# Patient Record
Sex: Female | Born: 1950 | Race: White | Hispanic: No | Smoking: Never smoker
Health system: Southern US, Community
[De-identification: ages and names within clinical notes are randomized; demographics above are authoritative.]

## PROBLEM LIST (undated history)

## (undated) HISTORY — PX: TUBAL LIGATION: SHX77

---

## 2012-02-29 ENCOUNTER — Emergency Department (HOSPITAL_BASED_OUTPATIENT_CLINIC_OR_DEPARTMENT_OTHER): Payer: Managed Care, Other (non HMO)

## 2012-02-29 ENCOUNTER — Encounter (HOSPITAL_BASED_OUTPATIENT_CLINIC_OR_DEPARTMENT_OTHER): Payer: Self-pay | Admitting: Family Medicine

## 2012-02-29 ENCOUNTER — Emergency Department (HOSPITAL_BASED_OUTPATIENT_CLINIC_OR_DEPARTMENT_OTHER)
Admission: EM | Admit: 2012-02-29 | Discharge: 2012-02-29 | Disposition: A | Discharge status: Home / Self Care | Payer: Managed Care, Other (non HMO) | Attending: Emergency Medicine | Admitting: Emergency Medicine

## 2012-02-29 DIAGNOSIS — R079 Chest pain, unspecified: Secondary | ICD-10-CM | POA: Insufficient documentation

## 2012-02-29 LAB — D-DIMER, QUANTITATIVE: D-Dimer, Quant: 0.27 ug/mL-FEU (ref 0.00–0.48)

## 2012-02-29 LAB — CK TOTAL AND CKMB (NOT AT ARMC)
CK, MB: 2.6 ng/mL (ref 0.3–4.0)
Total CK: 118 U/L (ref 7–177)

## 2012-02-29 LAB — CBC WITH DIFFERENTIAL/PLATELET
Basophils Absolute: 0 10*3/uL (ref 0.0–0.1)
Basophils Relative: 0 % (ref 0–1)
HCT: 38.9 % (ref 36.0–46.0)
MCHC: 33.2 g/dL (ref 30.0–36.0)
Monocytes Absolute: 0.6 10*3/uL (ref 0.1–1.0)
Neutro Abs: 5.1 10*3/uL (ref 1.7–7.7)
Neutrophils Relative %: 59 % (ref 43–77)
Platelets: 225 10*3/uL (ref 150–400)
RDW: 13.2 % (ref 11.5–15.5)

## 2012-02-29 LAB — COMPREHENSIVE METABOLIC PANEL
AST: 16 U/L (ref 0–37)
Albumin: 4.1 g/dL (ref 3.5–5.2)
Chloride: 99 mEq/L (ref 96–112)
Creatinine, Ser: 0.8 mg/dL (ref 0.50–1.10)
Sodium: 138 mEq/L (ref 135–145)
Total Bilirubin: 0.2 mg/dL — ABNORMAL LOW (ref 0.3–1.2)

## 2012-02-29 MED ORDER — HYDROCODONE-ACETAMINOPHEN 5-325 MG PO TABS: 1.0000 | ORAL_TABLET | Freq: Four times a day (QID) | ORAL | Status: AC | PRN

## 2012-02-29 MED ORDER — ASPIRIN 81 MG PO CHEW
324.0000 mg | CHEWABLE_TABLET | Freq: Once | ORAL | Status: AC
  Administered 2012-02-29: 324 mg via ORAL
  Filled 2012-02-29: qty 4

## 2012-02-29 NOTE — ED Notes (Signed)
Pt states that she is not experiencing any chest pain at this time.

## 2012-02-29 NOTE — ED Notes (Signed)
Report received from [place]. Care assumed.

## 2012-02-29 NOTE — ED Notes (Signed)
MD at bedside. 

## 2012-02-29 NOTE — ED Notes (Signed)
Pt states that she has experienced a decrease in chest pain since arrival. States she feels "like I am in a safe place. I have been really anxious."

## 2012-02-29 NOTE — ED Notes (Signed)
Pt c/o chest pain since last Friday worse and more constant today. Pt denies shob, n/v, dizziness. Pt sts she has h/o palpitations and was seen on Thursday at [facility] in [place] and sts cardiac panel and xr was negative.

## 2012-02-29 NOTE — ED Provider Notes (Signed)
History     CSN: [id number]  Arrival date & time 02/29/12  1439   First MD Initiated Contact with Patient 02/29/12 1519      Chief Complaint  Patient presents with  . Chest Pain    (Consider location/radiation/quality/duration/timing/severity/associated sxs/prior treatment) Patient is a 61 y.o. female presenting with chest pain. The history is provided by the patient.  Chest Pain The chest pain began 3 - 5 days ago. Chest pain occurs constantly. The chest pain is worsening. At its most intense, the pain is at 8/10. The pain is currently at 8/10. The quality of the pain is described as aching and sharp. The pain does not radiate. Pertinent negatives for primary symptoms include no fever, no syncope, no shortness of breath, no cough, no abdominal pain, no nausea and no vomiting.  Pertinent negatives for associated symptoms include no diaphoresis and no near-syncope. She tried aspirin for the symptoms. Risk factors include no known risk factors.    patient with left-sided substernal chest pain it does move down to the lower part of the chest on the left side that has essentially been constant since Friday that would be for 5 days almost better with moving and better with food no coronary artery disease, no hypertension no diabetes. Patient was seen at the [facility] on Thursday for palpitations and was referred to cardiology. They noted PVCs at that time. Patient is taking a baby aspirin a day.  History reviewed. No pertinent past medical history.  History reviewed. No pertinent past surgical history.  No family history on file.  History  Substance Use Topics  . Smoking status: Never Smoker   . Smokeless tobacco: Not on file  . Alcohol Use: No    OB History    Grav Para Term Preterm Abortions TAB SAB Ect Mult Living                  Review of Systems  Constitutional: Negative for fever and diaphoresis.  HENT: Negative for neck pain.   Eyes: Negative for redness and  visual disturbance.  Respiratory: Negative for cough and shortness of breath.   Cardiovascular: Positive for chest pain. Negative for syncope and near-syncope.  Gastrointestinal: Negative for nausea, vomiting and abdominal pain.  Genitourinary: Negative for dysuria.  Musculoskeletal: Negative for back pain.  Skin: Negative for rash.  Neurological: Negative for headaches.  Hematological: Does not bruise/bleed easily.    Allergies  Zithromax  Home Medications   Current Outpatient Rx  Name Route Sig Dispense Refill  . ASPIRIN 81 MG PO TABS Oral Take 81 mg by mouth daily.    Marland Kitchen NAPROXEN SODIUM 220 MG PO TABS Oral Take 440 mg by mouth 2 (two) times daily with a meal. For pain.    Marland Kitchen HYDROCODONE-ACETAMINOPHEN 5-325 MG PO TABS Oral Take 1-2 tablets by mouth every 6 (six) hours as needed for pain. 10 tablet 0    BP 146/76  Pulse 81  Resp 18  Ht 5\' 3"  (1.6 m)  Wt 198 lb (89.812 kg)  BMI 35.07 kg/m2  SpO2 98%  Physical Exam  Nursing note and vitals reviewed. Constitutional: She is oriented to person, place, and time. She appears well-developed and well-nourished.  HENT:  Head: Normocephalic and atraumatic.  Eyes: Conjunctivae and EOM are normal. Pupils are equal, round, and reactive to light.  Neck: Neck supple.  Cardiovascular: Normal rate and regular rhythm.   No murmur heard. Pulmonary/Chest: Effort normal and breath sounds normal.  Abdominal: Soft. Bowel  sounds are normal. There is no tenderness.  Musculoskeletal: Normal range of motion.  Neurological: She is alert and oriented to person, place, and time. No cranial nerve deficit. She exhibits normal muscle tone. Coordination normal.  Skin: Skin is warm. No rash noted.    ED Course  Procedures (including critical care time)  Labs Reviewed  COMPREHENSIVE METABOLIC PANEL - Abnormal; Notable for the following:    Total Bilirubin 0.2 (*)     GFR calc non Af Amer 77 (*)     GFR calc Af Amer 90 (*)     All other components  within normal limits  CBC WITH DIFFERENTIAL  TROPONIN I  CK TOTAL AND CKMB  D-DIMER, QUANTITATIVE   Dg Chest 2 View  02/29/2012  *RADIOLOGY REPORT*  Clinical Data: Sternal chest pain.  CHEST - 2 VIEW  Comparison: None.  Findings: Cardiac silhouette normal in size.  Thoracic aorta tortuous and mildly atherosclerotic.  Hilar and mediastinal contours otherwise unremarkable.  Lungs clear.  Bronchovascular markings normal.  Pulmonary vascularity normal.  No pneumothorax. No pleural effusions.  Degenerative changes involving the thoracic spine.  IMPRESSION: No acute cardiopulmonary disease.  Original Report Authenticated By: Arnell Sieving, M.D.   Results for orders placed during the hospital encounter of 02/29/12  CBC WITH DIFFERENTIAL      Component Value Range   WBC 8.7  4.0 - 10.5 K/uL   RBC 4.86  3.87 - 5.11 MIL/uL   Hemoglobin 12.9  12.0 - 15.0 g/dL   HCT 84.6  96.2 - 95.2 %   MCV 80.0  78.0 - 100.0 fL   MCH 26.5  26.0 - 34.0 pg   MCHC 33.2  30.0 - 36.0 g/dL   RDW 84.1  32.4 - 40.1 %   Platelets 225  150 - 400 K/uL   Neutrophils Relative 59  43 - 77 %   Neutro Abs 5.1  1.7 - 7.7 K/uL   Lymphocytes Relative 30  12 - 46 %   Lymphs Abs 2.6  0.7 - 4.0 K/uL   Monocytes Relative 6  3 - 12 %   Monocytes Absolute 0.6  0.1 - 1.0 K/uL   Eosinophils Relative 4  0 - 5 %   Eosinophils Absolute 0.4  0.0 - 0.7 K/uL   Basophils Relative 0  0 - 1 %   Basophils Absolute 0.0  0.0 - 0.1 K/uL  COMPREHENSIVE METABOLIC PANEL      Component Value Range   Sodium 138  135 - 145 mEq/L   Potassium 3.9  3.5 - 5.1 mEq/L   Chloride 99  96 - 112 mEq/L   CO2 27  19 - 32 mEq/L   Glucose, Bld 95  70 - 99 mg/dL   BUN 18  6 - 23 mg/dL   Creatinine, Ser 0.27  0.50 - 1.10 mg/dL   Calcium 9.9  8.4 - 25.3 mg/dL   Total Protein 7.3  6.0 - 8.3 g/dL   Albumin 4.1  3.5 - 5.2 g/dL   AST 16  0 - 37 U/L   ALT 12  0 - 35 U/L   Alkaline Phosphatase 82  39 - 117 U/L   Total Bilirubin 0.2 (*) 0.3 - 1.2 mg/dL   GFR  calc non Af Amer 77 (*) >90 mL/min   GFR calc Af Amer 90 (*) >90 mL/min  TROPONIN I      Component Value Range   Troponin I <0.30  <0.30 ng/mL  CK TOTAL AND  CKMB      Component Value Range   Total CK 118  7 - 177 U/L   CK, MB 2.6  0.3 - 4.0 ng/mL   Relative Index 2.2  0.0 - 2.5  D-DIMER, QUANTITATIVE      Component Value Range   D-Dimer, Quant 0.27  0.00 - 0.48 ug/mL-FEU    Date: 02/29/2012  Rate: 79  Rhythm: normal sinus rhythm and premature ventricular contractions (PVC)  QRS Axis: left  Intervals: normal  ST/T Wave abnormalities: nonspecific ST/T changes  Conduction Disutrbances:nonspecific intraventricular conduction delay  Narrative Interpretation:   Old EKG Reviewed: none available    1. Chest pain       MDM    Patient's chest pain now resolved. Cardiac marker was negative d-dimer negative chest x-ray without acute findings. Laboratory workup without significant abnormalities no anemia no leukocytosis no electrolyte abnormalities. Patient RE has followup in [facility] cardiology Dr. Lorenso Courier on the 18th patient she keep this appointment. Recommend continuing the aspirin a day. Today's workup without evidence of pneumonia pneumothorax pulmonary embolism based on d-dimer or acute cardiac event based on EKG or cardiac marker patient's had pain essentially constantly for the past 5 days. Based on that cardiac marker should show some significant abnormality. EKG does show some PVCs that she's had previously and that was the reason for the original cardiology referral.      Shelda Jakes, MD 02/29/12 [phone]

## 2012-02-29 NOTE — ED Notes (Signed)
Patient transported to CT 

## 2013-08-26 ENCOUNTER — Encounter (HOSPITAL_BASED_OUTPATIENT_CLINIC_OR_DEPARTMENT_OTHER): Payer: Self-pay | Admitting: Emergency Medicine

## 2013-08-26 ENCOUNTER — Emergency Department (HOSPITAL_BASED_OUTPATIENT_CLINIC_OR_DEPARTMENT_OTHER): Payer: Managed Care, Other (non HMO)

## 2013-08-26 ENCOUNTER — Emergency Department (HOSPITAL_BASED_OUTPATIENT_CLINIC_OR_DEPARTMENT_OTHER)
Admission: EM | Admit: 2013-08-26 | Discharge: 2013-08-26 | Disposition: A | Discharge status: Home / Self Care | Payer: Managed Care, Other (non HMO) | Attending: Emergency Medicine | Admitting: Emergency Medicine

## 2013-08-26 DIAGNOSIS — F4321 Adjustment disorder with depressed mood: Secondary | ICD-10-CM | POA: Insufficient documentation

## 2013-08-26 DIAGNOSIS — R002 Palpitations: Secondary | ICD-10-CM

## 2013-08-26 DIAGNOSIS — Z7982 Long term (current) use of aspirin: Secondary | ICD-10-CM | POA: Insufficient documentation

## 2013-08-26 DIAGNOSIS — R5383 Other fatigue: Secondary | ICD-10-CM

## 2013-08-26 DIAGNOSIS — R42 Dizziness and giddiness: Secondary | ICD-10-CM | POA: Insufficient documentation

## 2013-08-26 DIAGNOSIS — R011 Cardiac murmur, unspecified: Secondary | ICD-10-CM | POA: Insufficient documentation

## 2013-08-26 DIAGNOSIS — R52 Pain, unspecified: Secondary | ICD-10-CM | POA: Insufficient documentation

## 2013-08-26 DIAGNOSIS — R5381 Other malaise: Secondary | ICD-10-CM | POA: Insufficient documentation

## 2013-08-26 LAB — CBC WITH DIFFERENTIAL/PLATELET
BASOS PCT: 0 % (ref 0–1)
Basophils Absolute: 0 10*3/uL (ref 0.0–0.1)
EOS ABS: 0.3 10*3/uL (ref 0.0–0.7)
EOS PCT: 4 % (ref 0–5)
HCT: 40.1 % (ref 36.0–46.0)
HEMOGLOBIN: 12.8 g/dL (ref 12.0–15.0)
LYMPHS ABS: 3.1 10*3/uL (ref 0.7–4.0)
Lymphocytes Relative: 40 % (ref 12–46)
MCH: 26.3 pg (ref 26.0–34.0)
MCHC: 31.9 g/dL (ref 30.0–36.0)
MCV: 82.3 fL (ref 78.0–100.0)
MONO ABS: 0.6 10*3/uL (ref 0.1–1.0)
MONOS PCT: 7 % (ref 3–12)
NEUTROS PCT: 49 % (ref 43–77)
Neutro Abs: 3.7 10*3/uL (ref 1.7–7.7)
Platelets: 234 10*3/uL (ref 150–400)
RBC: 4.87 MIL/uL (ref 3.87–5.11)
RDW: 13.7 % (ref 11.5–15.5)
WBC: 7.7 10*3/uL (ref 4.0–10.5)

## 2013-08-26 LAB — BASIC METABOLIC PANEL
BUN: 16 mg/dL (ref 6–23)
CALCIUM: 9.8 mg/dL (ref 8.4–10.5)
CO2: 27 mEq/L (ref 19–32)
CREATININE: 1 mg/dL (ref 0.50–1.10)
Chloride: 103 mEq/L (ref 96–112)
GFR, EST AFRICAN AMERICAN: 68 mL/min — AB (ref >90)
GFR, EST NON AFRICAN AMERICAN: 59 mL/min — AB (ref >90)
GLUCOSE: 136 mg/dL — AB (ref 70–99)
Potassium: 3.8 mEq/L (ref 3.7–5.3)
Sodium: 143 mEq/L (ref 137–147)

## 2013-08-26 LAB — MAGNESIUM: Magnesium: 2.1 mg/dL (ref 1.5–2.5)

## 2013-08-26 MED ORDER — ESZOPICLONE 1 MG PO TABS: 1.0000 mg | ORAL_TABLET | Freq: Every evening | ORAL | Status: AC | PRN

## 2013-08-26 NOTE — ED Provider Notes (Signed)
CSN: [id number]     Arrival date & time 08/26/13  1915 History  This chart was scribed for Rolland Porter, MD by Smiley Houseman, ED Scribe. The patient was seen in room MH06/MH06. Patient's care was started at 7:31 PM.    Chief Complaint  Patient presents with  . Palpitations   The history is provided by the patient. No language interpreter was used.   HPI Comments: Emogene Muratalla is a 63 y.o. female who presents to the Emergency Department complaining of intermittent palpitations that occur two or three times a day for about 7 days.  Pt states she was sitting at work today and around 6:25PM she became dizzy with associated palpitations.  Pt currently denies feeling dizzy, but states she feels very light headed.  She reports that she feels very fatigued and has generalized myalgias.  Pt states that these episodes are different from her normal PVC's.  Pt states that she has SOB, which never occurs with her normal PVC's.  Pt denies chest pain, dizziness, nausea, emesis, and vision changes.  Pt states that she has had two recent deaths in her family which has caused major anxiety.  Pt states that she is not eating very well, because of anxiety and stress.  Pt states that she has lost about 30 pounds over the past 6 months.  Pt states that she does get acid reflux and takes medications as needed.  Pt denies any new medications.  Pt denies h/o of thyroid problems, HTN, and diabetes.  Pt states her mother has a h/o of heart problems, but pt denies any heart problems.  Pt states that she had angiogram in 1997.     History reviewed. No pertinent past medical history. Past Surgical History  Procedure Laterality Date  . Tubal ligation     History reviewed. No pertinent family history. History  Substance Use Topics  . Smoking status: Never Smoker   . Smokeless tobacco: Not on file  . Alcohol Use: No   OB History   Grav Para Term Preterm Abortions TAB SAB Ect Mult Living                 Review of Systems   Constitutional: Negative for fever, chills, diaphoresis, appetite change and fatigue.  HENT: Negative for mouth sores, sore throat and trouble swallowing.   Eyes: Negative for visual disturbance.  Respiratory: Positive for shortness of breath. Negative for cough, chest tightness and wheezing.   Cardiovascular: Positive for palpitations. Negative for chest pain.  Gastrointestinal: Negative for nausea, vomiting, abdominal pain, diarrhea and abdominal distention.  Endocrine: Negative for polydipsia, polyphagia and polyuria.  Genitourinary: Negative for dysuria, frequency and hematuria.  Musculoskeletal: Positive for myalgias. Negative for gait problem.  Skin: Negative for color change, pallor and rash.  Neurological: Positive for light-headedness. Negative for dizziness, syncope and headaches.  Hematological: Does not bruise/bleed easily.  Psychiatric/Behavioral: Negative for behavioral problems and confusion.    Allergies  Zithromax  Home Medications   Current Outpatient Rx  Name  Route  Sig  Dispense  Refill  . aspirin 81 MG tablet   Oral   Take 81 mg by mouth daily.         . eszopiclone (LUNESTA) 1 MG TABS tablet   Oral   Take 1 tablet (1 mg total) by mouth at bedtime as needed for sleep. Take immediately before bedtime   20 tablet   0   . naproxen sodium (ANAPROX) 220 MG tablet   Oral  Take 440 mg by mouth 2 (two) times daily with a meal. For pain.          Triage Vitals: BP 151/88  Pulse 90  Temp(Src) 98.2 F (36.8 C) (Oral)  Resp 20  Ht 5\' 3"  (1.6 m)  Wt 198 lb (89.812 kg)  BMI 35.08 kg/m2  SpO2 98% Physical Exam  Nursing note and vitals reviewed. Constitutional: She appears well-developed and well-nourished. No distress.  Morbidly obese  HENT:  Head: Normocephalic and atraumatic.  Eyes: Conjunctivae and EOM are normal. Right eye exhibits no discharge. Left eye exhibits no discharge.  Neck: Neck supple. No JVD present. Carotid bruit is not present. No  thyromegaly present.  Cardiovascular: Normal rate and regular rhythm.  Exam reveals no gallop and no friction rub.   Murmur heard.  Systolic murmur is present with a grade of 2/6  Pulmonary/Chest: Effort normal and breath sounds normal. No respiratory distress. She has no wheezes. She has no rales.  Abdominal: Soft. She exhibits no distension. There is no tenderness.  Musculoskeletal: She exhibits no edema and no tenderness.  Neurological: She is alert.  Skin: Skin is warm and dry. No rash noted.  Psychiatric: She has a normal mood and affect. Her behavior is normal. Judgment and thought content normal.    ED Course  Procedures (including critical care time) DIAGNOSTIC STUDIES: Oxygen Saturation is 98% on RA, normal by my interpretation.    COORDINATION OF CARE: 7:47 PM-Will order chest x-ray and basic labs.  Patient informed of current plan of treatment and evaluation and agrees with plan.    Labs Review Labs Reviewed  BASIC METABOLIC PANEL - Abnormal; Notable for the following:    Glucose, Bld 136 (*)    GFR calc non Af Amer 59 (*)    GFR calc Af Amer 68 (*)    All other components within normal limits  CBC WITH DIFFERENTIAL  MAGNESIUM  TSH   Imaging Review Dg Chest 2 View  08/26/2013   CLINICAL DATA:  Chest palpitations 2-3 times per day for 1 week.  EXAM: CHEST  2 VIEW  COMPARISON:  PA and lateral chest 02/29/2012.  FINDINGS: Heart size and mediastinal contours are within normal limits. Both lungs are clear. Visualized skeletal structures are unremarkable.  IMPRESSION: Negative exam.   Electronically Signed   By: Drusilla Kanner M.D.   On: 08/26/2013 20:07    EKG Interpretation    Date/Time:  Monday August 26 2013 19:27:12 EST Ventricular Rate:  85 PR Interval:  178 QRS Duration: 118 QT Interval:  392 QTC Calculation: 466 R Axis:   -54 Text Interpretation:  Normal sinus rhythm with sinus arrhythmia Left anterior fascicular block Left ventricular hypertrophy with QRS  widening and repolarization abnormality Cannot rule out Septal infarct , age undetermined Abnormal ECG PVCs noted on 02-29-2012 Not present on current tracing Confirmed by Fayrene Fearing  MD, Myrissa Chipley ([id number]) on 08/26/2013 7:30:11 PM            MDM   Final diagnoses:  Grieving  Palpitations  Heart murmur   I personally performed the services described in this documentation, which was scribed in my presence. The recorded information has been reviewed and is accurate.  Having similar symptoms may be related to her grief and anxiety from her recent losses. She has LVH on EKG that is unchanged. She has a soft murmur that she has not been told about before. Clinically as a mitral regurgitation murmur. Her morphology of her EKG is unchanged.  She does not have cardiomegaly on chest x-ray. Clinically or radiographically no signs of congestive heart failure. Normal renal function. Normal electrolytes. I've offered her Lunesta in the interval Toprol some sleep. She only been getting 2-3 hours per night. She has prolonged episodes of palpitations or anything associated with pain or syncope bastard recheck. Has been given the number for cardiology on-call for a recheck regarding her palpitations and her murmur. Avoid caffeine. Avoid over-the-counter cough and cold medications. Stay hydrated. Recheck with chest pain syncope worsening symptoms   Rolland PorterMark Flordia Kassem, MD 08/26/13 2033

## 2013-08-26 NOTE — ED Notes (Signed)
Pt c/o body aches x 2 weeks-c/o feeling like heart racing 2-3 x per day for the last week-denies pain, sob, dizziness, diaphoresis with event-tonight episodes started approx 35 min PTA-denies feeling like heart pounding-pt states she has been under a lot of stress with 2 recent deaths in her family

## 2013-08-26 NOTE — Discharge Instructions (Signed)
Grief Reaction Grief is a normal response to the death of someone close to you. Feelings of fear, anger, and guilt can affect almost everyone who loses someone they love. Symptoms of depression are also common. These include problems with sleep, loss of appetite, and lack of energy. These grief reaction symptoms often last for weeks to months after a loss. They may also return during special times that remind you of the person you lost, such as an anniversary or birthday. Anxiety, insomnia, irritability, and deep depression may last beyond the period of normal grief. If you experience these feelings for 6 months or longer, you may have clinical depression. Clinical depression requires further medical attention. If you think that you have clinical depression, you should contact your caregiver. If you have a history of depression and or a family history of depression, you are at greater risk of clinical depression. You are also at greater risk of developing clinical depression if the loss was traumatic or the loss was of someone with whom you had unresolved issues.  A grief reaction can become complicated by being blocked. This means being unable to cry or express extreme emotions. This may prolong the grieving period and worsen the emotional effects of the loss. Mourning is a natural event in human life. A healthy grief reaction is one that is not blocked . It requires a time of sadness and readjustment.It is very important to share your sorrow and fear with others, especially close friends and family. Professional counselors and clergy can also help you process your grief. Document Released: 07/04/2005 Document Revised: 09/26/2011 Document Reviewed: 03/14/2006 [facility] Patient Information 2014 [place], [place].  Palpitations  A palpitation is the feeling that your heartbeat is irregular. It may feel like your heart is fluttering or skipping a beat. It may also feel like your heart is beating faster than  normal. This is usually not a serious problem. In some cases, you may need more medical tests. HOME CARE  Avoid:  Caffeine in coffee, tea, soft drinks, diet pills, and energy drinks.  Chocolate.  Alcohol.  Stop smoking if you smoke.  Reduce your stress and anxiety. Try:  A method that measures bodily functions so you can learn to control them (biofeedback).  Yoga.  Meditation.  Physical activity such as swimming, jogging, or walking.  Get plenty of rest and sleep. GET HELP RIGHT AWAY IF:   You have chest pain.  You feel short of breath.  You have a very bad headache.  You feel dizzy or pass out (faint).  Your fast or irregular heartbeat continues after 24 hours.  Your palpitations occur more often. MAKE SURE YOU:   Understand these instructions.  Will watch your condition.  Will get help right away if you are not doing well or get worse. Document Released: 04/12/2008 Document Revised: 01/03/2012 Document Reviewed: 09/02/2011 [facility] Patient Information 2014 [place], [place].  Palpitations  A palpitation is the feeling that your heartbeat is irregular or is faster than normal. It may feel like your heart is fluttering or skipping a beat. Palpitations are usually not a serious problem. However, in some cases, you may need further medical evaluation. CAUSES  Palpitations can be caused by:  Smoking.  Caffeine or other stimulants, such as diet pills or energy drinks.  Alcohol.  Stress and anxiety.  Strenuous physical activity.  Fatigue.  Certain medicines.  Heart disease, especially if you have a history of arrhythmias. This includes atrial fibrillation, atrial flutter, or supraventricular tachycardia.  An  improperly working pacemaker or defibrillator. DIAGNOSIS  To find the cause of your palpitations, your caregiver will take your history and perform a physical exam. Tests may also be done, including:  Electrocardiography (ECG). This test records  the heart's electrical activity.  Cardiac monitoring. This allows your caregiver to monitor your heart rate and rhythm in real time.  Holter monitor. This is a portable device that records your heartbeat and can help diagnose heart arrhythmias. It allows your caregiver to track your heart activity for several days, if needed.  Stress tests by exercise or by giving medicine that makes the heart beat faster. TREATMENT  Treatment of palpitations depends on the cause of your symptoms and can vary greatly. Most cases of palpitations do not require any treatment other than time, relaxation, and monitoring your symptoms. Other causes, such as atrial fibrillation, atrial flutter, or supraventricular tachycardia, usually require further treatment. HOME CARE INSTRUCTIONS   Avoid:  Caffeinated coffee, tea, soft drinks, diet pills, and energy drinks.  Chocolate.  Alcohol.  Stop smoking if you smoke.  Reduce your stress and anxiety. Things that can help you relax include:  A method that measures bodily functions so you can learn to control them (biofeedback).  Yoga.  Meditation.  Physical activity such as swimming, jogging, or walking.  Get plenty of rest and sleep. SEEK MEDICAL CARE IF:   You continue to have a fast or irregular heartbeat beyond 24 hours.  Your palpitations occur more often. SEEK IMMEDIATE MEDICAL CARE IF:  You develop chest pain or shortness of breath.  You have a severe headache.  You feel dizzy, or you faint. MAKE SURE YOU:  Understand these instructions.  Will watch your condition.  Will get help right away if you are not doing well or get worse. Document Released: 07/01/2000 Document Revised: 10/29/2012 Document Reviewed: 09/02/2011 [facility] Patient Information 2014 [place], [place].  Heart Murmur A heart murmur is an extra sound heard by your health care provider when listening to your heart with a device called a stethoscope. The sound comes from  turbulence when blood flows through the heart and may be a "hum" or "whoosh" sound heard when the heart beats. There are two types of heart murmurs:  Innocent murmurs Most people with this type of heart murmur do not have a heart problem. Many children have innocent heart murmurs. Your health care provider may suggest some basic testing to know whether your murmur is an innocent murmur. If an innocent heart murmur is found, there is no need for further tests or treatment and no need to restrict activities or stop playing sports.  Abnormal murmurs These types of murmurs can occur in children and adults. In children, abnormal heart murmurs are typically caused from heart defects that are present at birth (congenital). In adults, abnormal murmurs are usually from heart valve problems caused by disease, infection, or aging. CAUSES  All heart murmurs are a result of an issue with your heart valves. Normally, these valves open to let blood flow through or out of your heart and then shut to keep it from flowing backward. If they do not work properly, you could have:  Regurgitation When blood leaks back through the valve in the wrong direction.  Mitral valve prolapse When the mitral valve of the heart has a loose flap and does not close tightly.  Stenosis When the valve does not open enough and blocks blood flow. SIGNS AND SYMPTOMS  Innocent murmurs do not cause symptoms, and many people  with abnormal murmurs may or may not have symptoms. If symptoms do develop, they may include:  Shortness of breath.  Blue coloring of the skin, especially on the fingertips.  Chest pain.  Palpitations, or feeling a fluttering or skipped heartbeat.  Fainting.  Persistent cough.  Getting tired much faster than expected. DIAGNOSIS  A heart murmur might be heard during a sports physical or during any type of examination. When a murmur is heard, it may suggest a possible problem. When this happens, your health care  provider may ask you to see a heart specialist (cardiologist). You may also be asked to have one or more heart tests. In these cases, testing may vary depending on what your health care provider heard. Tests for a heart murmur may include:  Electrocardiogram.  Echocardiogram.  MRI. For children and adults who have an abnormal heart murmur and want to play sports, it is important to complete testing, review test results, and receive recommendations from your health care provider. If heart disease is present, it may not be safe to play. TREATMENT  Innocent murmurs require no treatment or activity restriction. If an abnormal murmur represents a problem with the heart, treatment will depend on the exact nature of the problem. In these cases, medicine or surgery may be needed to treat the problem. HOME CARE INSTRUCTIONS If you want to participate in sports or other types of strenuous physical activity, it is important to discuss this first with your health care provider. If the murmur represents a problem with the heart and you choose to participate in sports, there is a small chance that a serious problem (including sudden death) could result.  SEEK MEDICAL CARE IF:   You feel that your symptoms are slowly worsening.  You develop any new symptoms that cause concern.  You feel that you are having side effects from any medicines prescribed. SEEK IMMEDIATE MEDICAL CARE IF:   You develop chest pain.  You have shortness of breath.  You notice that your heart beats irregularly often enough to cause you to worry.  You have fainting spells.  Your symptoms suddenly get worse. Document Released: 08/11/2004 Document Revised: 04/24/2013 Document Reviewed: 03/11/2013 [facility] Patient Information 2014 [place].

## 2013-08-27 LAB — TSH: TSH: 1.941 u[IU]/mL (ref 0.350–4.500)

## 2013-11-20 IMAGING — CR DG CHEST 2V
2 series · 2 of 2 positions shown · non-contrast
Comparison: None.

CLINICAL DATA: Sternal chest pain.

CHEST - 2 VIEW

[w chest pa]
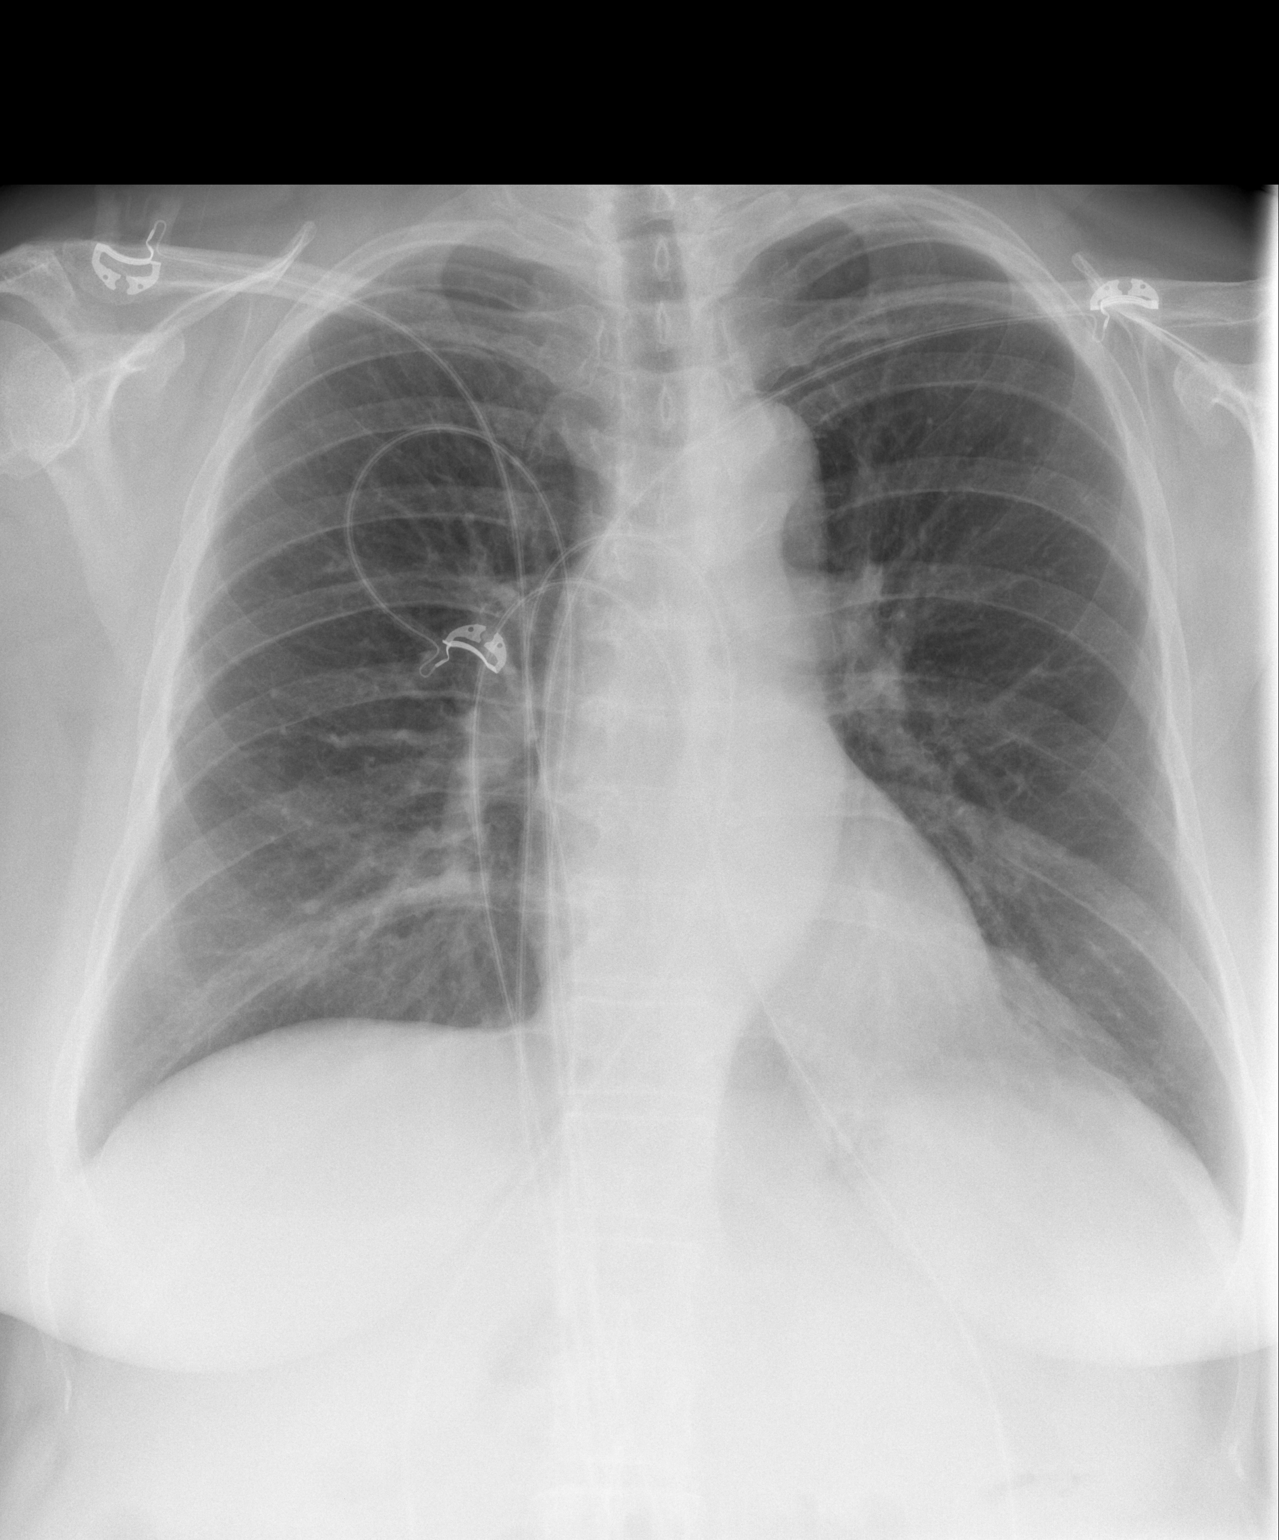

[w chest lat]
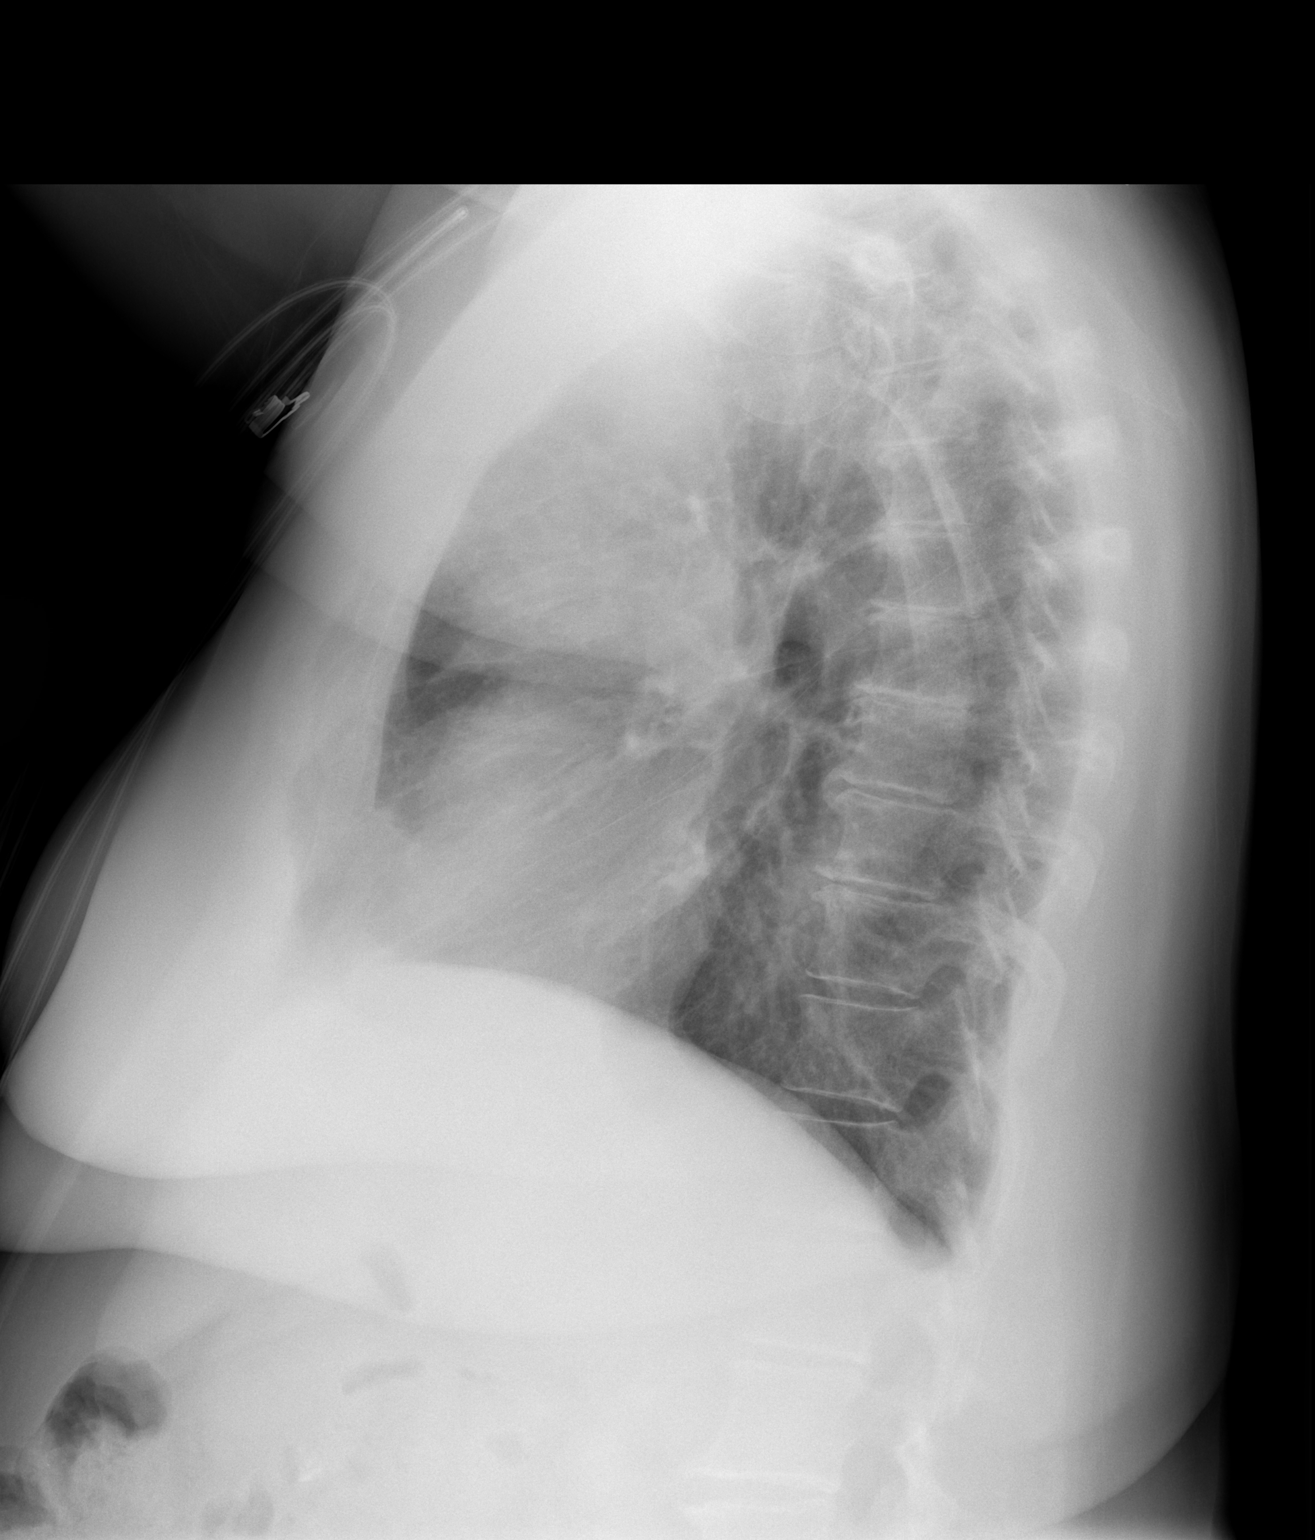

[2 of 2 positions shown; findings below may reference images not displayed]

FINDINGS: Cardiac silhouette normal in size.  Thoracic aorta
tortuous and mildly atherosclerotic.  Hilar and mediastinal
contours otherwise unremarkable.  Lungs clear.  Bronchovascular
markings normal.  Pulmonary vascularity normal.  No pneumothorax.
No pleural effusions.  Degenerative changes involving the thoracic
spine.
IMPRESSION: No acute cardiopulmonary disease.

## 2015-05-18 IMAGING — CR DG CHEST 2V
2 series · 2 of 2 positions shown · non-contrast
Comparison: PA and lateral chest 02/29/2012.

CLINICAL DATA: Chest palpitations 2-3 times per day for 1 week.

EXAM:
CHEST  2 VIEW

[w chest pa]
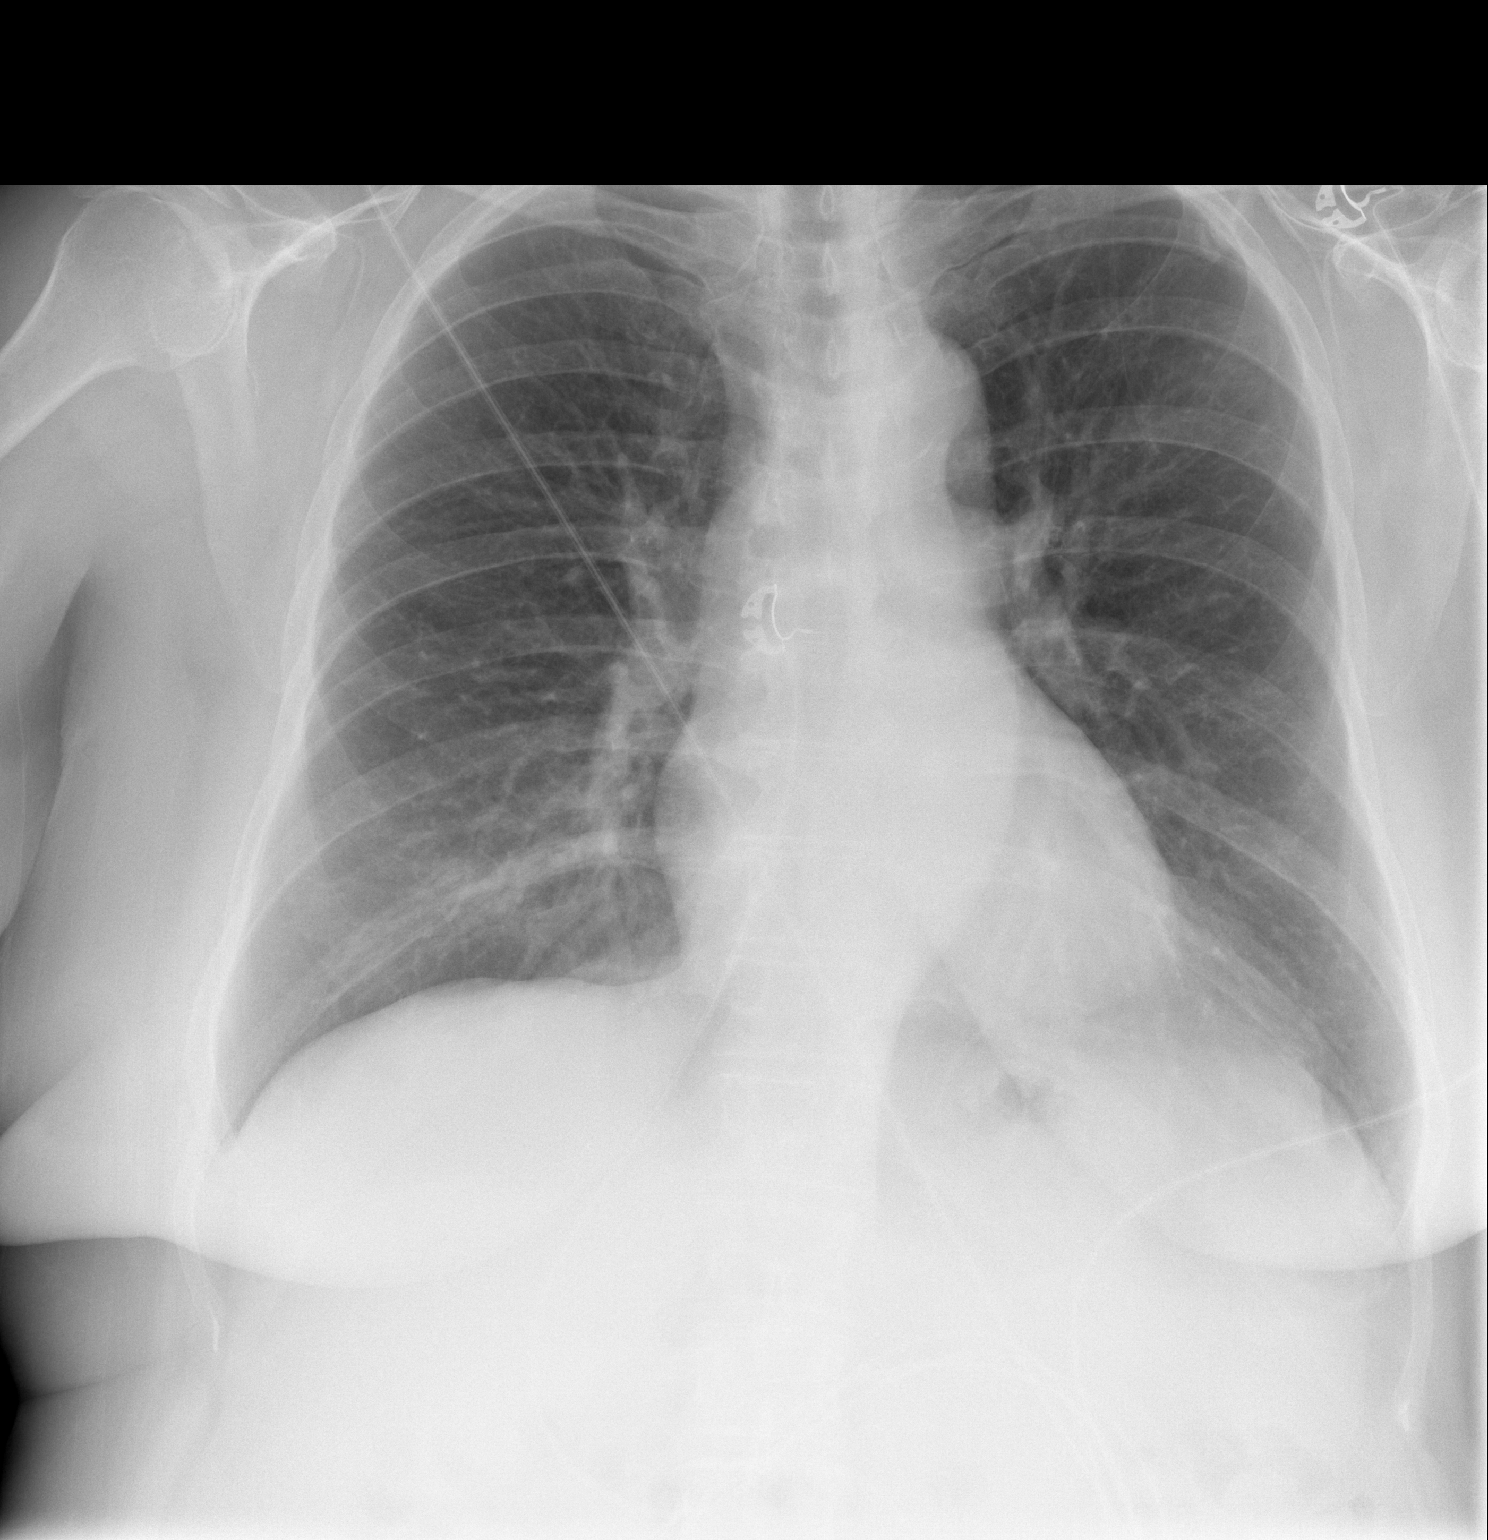

[w chest lat]
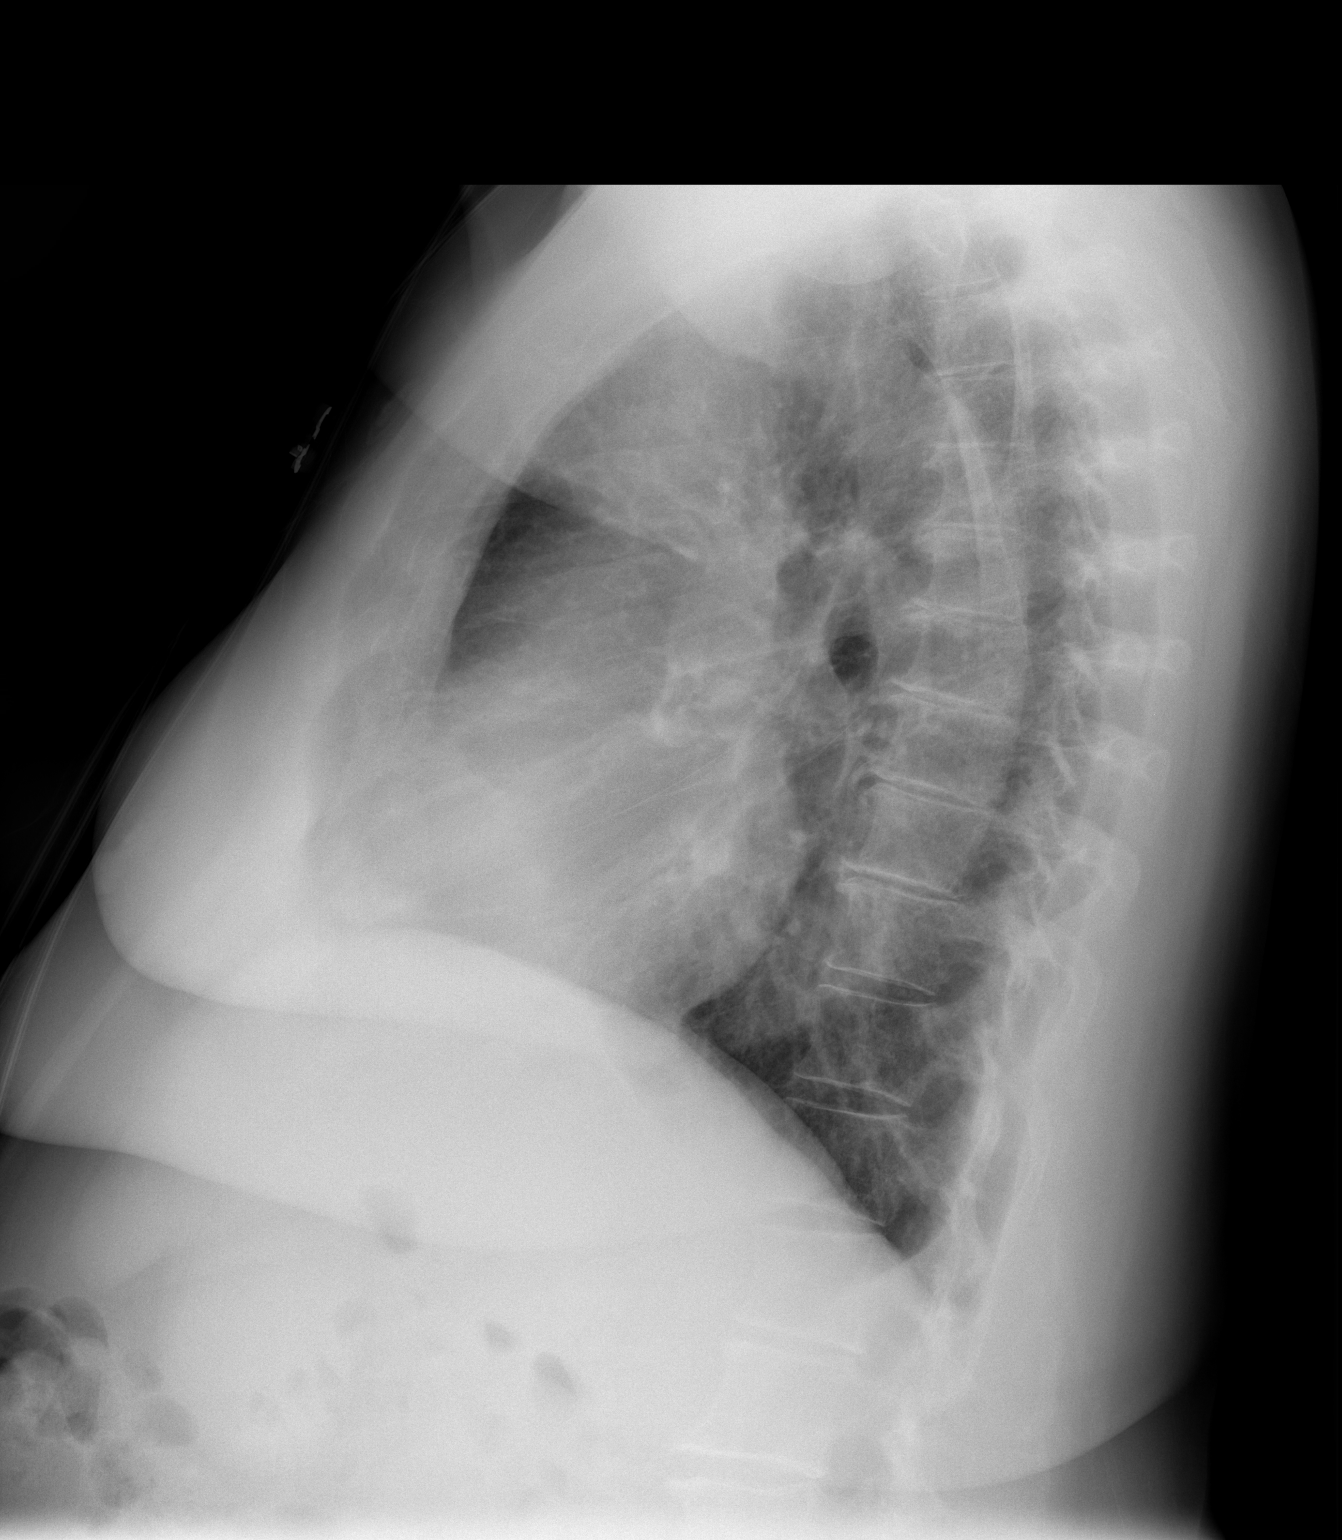

[2 of 2 positions shown; findings below may reference images not displayed]

FINDINGS: Heart size and mediastinal contours are within normal limits. Both
lungs are clear. Visualized skeletal structures are unremarkable.
IMPRESSION: Negative exam.
